# Patient Record
Sex: Female | Born: 1990 | Race: White | Hispanic: Yes
Health system: Southern US, Community
[De-identification: ages and names within clinical notes are randomized; demographics above are authoritative.]

---

## 2019-10-18 ENCOUNTER — Emergency Department (HOSPITAL_COMMUNITY)
Admission: EM | Admit: 2019-10-18 | Discharge: 2019-10-19 | Disposition: A | Discharge status: Home / Self Care | Payer: Self-pay | Attending: Emergency Medicine | Admitting: Emergency Medicine

## 2019-10-18 ENCOUNTER — Emergency Department (HOSPITAL_COMMUNITY): Payer: Self-pay

## 2019-10-18 ENCOUNTER — Encounter (HOSPITAL_COMMUNITY): Payer: Self-pay

## 2019-10-18 ENCOUNTER — Other Ambulatory Visit: Payer: Self-pay

## 2019-10-18 DIAGNOSIS — R0602 Shortness of breath: Secondary | ICD-10-CM | POA: Insufficient documentation

## 2019-10-18 DIAGNOSIS — R42 Dizziness and giddiness: Secondary | ICD-10-CM | POA: Insufficient documentation

## 2019-10-18 DIAGNOSIS — R0789 Other chest pain: Secondary | ICD-10-CM | POA: Insufficient documentation

## 2019-10-18 DIAGNOSIS — D649 Anemia, unspecified: Secondary | ICD-10-CM | POA: Insufficient documentation

## 2019-10-18 DIAGNOSIS — G43009 Migraine without aura, not intractable, without status migrainosus: Secondary | ICD-10-CM | POA: Insufficient documentation

## 2019-10-18 LAB — BASIC METABOLIC PANEL
Anion gap: 10 (ref 5–15)
BUN: 7 mg/dL (ref 6–20)
CO2: 20 mmol/L — ABNORMAL LOW (ref 22–32)
Calcium: 9.6 mg/dL (ref 8.9–10.3)
Chloride: 109 mmol/L (ref 98–111)
Creatinine, Ser: 0.54 mg/dL (ref 0.44–1.00)
GFR calc Af Amer: >60 mL/min (ref >60)
GFR calc non Af Amer: >60 mL/min (ref >60)
Glucose, Bld: 91 mg/dL (ref 70–99)
Potassium: 3.2 mmol/L — ABNORMAL LOW (ref 3.5–5.1)
Sodium: 139 mmol/L (ref 135–145)

## 2019-10-18 LAB — CBC
HCT: 28.2 % — ABNORMAL LOW (ref 36.0–46.0)
Hemoglobin: 8.3 g/dL — ABNORMAL LOW (ref 12.0–15.0)
MCH: 21.1 pg — ABNORMAL LOW (ref 26.0–34.0)
MCHC: 29.4 g/dL — ABNORMAL LOW (ref 30.0–36.0)
MCV: 71.6 fL — ABNORMAL LOW (ref 80.0–100.0)
Platelets: 441 10*3/uL — ABNORMAL HIGH (ref 150–400)
RBC: 3.94 MIL/uL (ref 3.87–5.11)
RDW: 18.9 % — ABNORMAL HIGH (ref 11.5–15.5)
WBC: 4.7 10*3/uL (ref 4.0–10.5)
nRBC: 0 % (ref 0.0–0.2)

## 2019-10-18 LAB — I-STAT BETA HCG BLOOD, ED (MC, WL, AP ONLY): I-stat hCG, quantitative: <5 m[IU]/mL (ref <5)

## 2019-10-18 LAB — TROPONIN I (HIGH SENSITIVITY): Troponin I (High Sensitivity): <2 ng/L (ref <18)

## 2019-10-18 NOTE — ED Triage Notes (Signed)
Pt from work with ems for chest pain, worse with deep inspiration VSS, pt a.o, resp e.u at this time.

## 2019-10-19 LAB — TROPONIN I (HIGH SENSITIVITY): Troponin I (High Sensitivity): <2 ng/L (ref <18)

## 2019-10-19 LAB — D-DIMER, QUANTITATIVE: D-Dimer, Quant: <0.27 ug/mL-FEU (ref 0.00–0.50)

## 2019-10-19 MED ORDER — MECLIZINE HCL 25 MG PO TABS
25.0000 mg | ORAL_TABLET | Freq: Once | ORAL | Status: AC
  Administered 2019-10-19: 01:00:00 25 mg via ORAL
  Filled 2019-10-19: qty 1

## 2019-10-19 MED ORDER — KETOROLAC TROMETHAMINE 30 MG/ML IJ SOLN
30.0000 mg | Freq: Once | INTRAMUSCULAR | Status: AC
  Administered 2019-10-19: 01:00:00 30 mg via INTRAVENOUS
  Filled 2019-10-19: qty 1

## 2019-10-19 MED ORDER — FERROUS SULFATE 325 (65 FE) MG PO TABS: 325.0000 mg | ORAL_TABLET | Freq: Every day | ORAL | 0 refills | Status: AC

## 2019-10-19 MED ORDER — SODIUM CHLORIDE 0.9 % IV BOLUS (SEPSIS)
1000.0000 mL | Freq: Once | INTRAVENOUS | Status: AC
  Administered 2019-10-19: 1000 mL via INTRAVENOUS

## 2019-10-19 MED ORDER — MECLIZINE HCL 25 MG PO TABS: 25.0000 mg | ORAL_TABLET | Freq: Three times a day (TID) | ORAL | 0 refills | Status: AC | PRN

## 2019-10-19 MED ORDER — IBUPROFEN 800 MG PO TABS: 800.0000 mg | ORAL_TABLET | Freq: Three times a day (TID) | ORAL | 0 refills | Status: AC | PRN

## 2019-10-19 NOTE — ED Provider Notes (Addendum)
TIME SEEN: 12:39 AM  CHIEF COMPLAINT: chest pain  HPI: Patient is a 29 y.o. F with no PMH who presents to ED with stabbing chest pain and SOB and vertigo.  Also c/o throbbing HA.  Has nausea and no vomiting.  No fever or cough.  Working at [employer] when started.  Feels like her legs are numb.  Patient is able to walk.  No back pain, urinary retention, incontinence.  ROS: See HPI Constitutional: no fever  Eyes: no drainage  ENT: no runny nose   Cardiovascular:   chest pain  Resp:  SOB  GI: no vomiting GU: no dysuria Integumentary: no rash  Allergy: no hives  Musculoskeletal: no leg swelling  Neurological: no slurred speech ROS otherwise negative  PAST MEDICAL HISTORY/PAST SURGICAL HISTORY:  History reviewed. No pertinent past medical history.  MEDICATIONS:  Prior to Admission medications   Not on File    ALLERGIES:  Not on File  SOCIAL HISTORY:  Social History   Tobacco Use  . Smoking status: Not on file  Substance Use Topics  . Alcohol use: Not on file    FAMILY HISTORY: No family history on file.  EXAM: BP 104/65 (BP Location: Left Arm)   Pulse 76   Temp 97.9 F (36.6 C) (Oral)   Resp 20   SpO2 100%  CONSTITUTIONAL: Alert and oriented and responds appropriately to questions. Well-appearing; well-nourished HEAD: Normocephalic EYES: Conjunctivae clear, pupils appear equal, EOM appear intact ENT: normal nose; moist mucous membranes NECK: Supple, normal ROM CARD: RRR; S1 and S2 appreciated; no murmurs, no clicks, no rubs, no gallops RESP: Normal chest excursion without splinting or tachypnea; breath sounds clear and equal bilaterally; no wheezes, no rhonchi, no rales, no hypoxia or respiratory distress, speaking full sentences ABD/GI: Normal bowel sounds; non-distended; soft, non-tender, no rebound, no guarding, no peritoneal signs, no hepatosplenomegaly BACK:  The back appears normal EXT: Normal ROM in all joints; no deformity noted, no edema; no  cyanosis SKIN: Normal color for age and race; warm; no rash on exposed skin NEURO: Moves all extremities equally, poor effort with lifting legs off the bed, reports decreased sensation in bilateral lower extremities but normal sensation diffusely otherwise, normal speech, normal movement of her upper extremities PSYCH: The patient's mood and manner are appropriate.   MEDICAL DECISION MAKING: Patient here with pleuritic chest pain.  Troponin negative.  EKG shows new ischemic change.  She is anemic with no old for comparison.  No bloody stools or melena.  She has regular menstrual cycles.  Recommended close PCP follow-up and will start iron supplements.  Will obtain D-dimer to rule out PE.  Chest x-ray clear.  Low suspicion for ACS, dissection.  No infectious symptoms.  Patient also complaining of headache and feeling like her legs are numb and weak.  No back pain, bowel or bladder incontinence.  Neurologic exam seems to be very effort dependent.  Doubt stroke given symptoms are bilateral.  She has no complaints of back pain, bowel or bladder incontinence, urinary retention.  Doubt cauda equina, spinal stenosis, epidural abscess or hematoma, discitis or osteomyelitis.  Patient also complaining of vertigo.  We will treat symptoms with IV fluids, meclizine, Toradol and reassess.  ED PROGRESS: Patient reports headache has improved.  She now tells me that she has a history of migraine headaches and this feels similar.  Will discharge with prescription of ibuprofen.  She has been able to ambulate on her own with steady gait not requiring assistance.  Will  discharge with meclizine as needed for vertigo.  Suspect peripheral in nature.  Doubt stroke.  Symptoms extremely atypical.  Able to eat and drink here without difficulty.  No vomiting.  Suspect anxiety contributing to her symptoms.  She has had 2 troponins that have been negative and a negative D-dimer.  I feel she is safe for discharge home.   At  this time, I do not feel there is any life-threatening condition present. I have reviewed, interpreted and discussed all results (EKG, imaging, lab, urine as appropriate) and exam findings with patient/family. I have reviewed nursing notes and appropriate previous records.  I feel the patient is safe to be discharged home without further emergent workup and can continue workup as an outpatient as needed. Discussed usual and customary return precautions. Patient/family verbalize understanding and are comfortable with this plan.  Outpatient follow-up has been provided as needed. All questions have been answered.    EKG Interpretation  Date/Time:  Monday October 18 2019 16:40:41 EST Ventricular Rate:  91 PR Interval:  138 QRS Duration: 72 QT Interval:  348 QTC Calculation: 428 R Axis:   78 Text Interpretation: Normal sinus rhythm Normal ECG No significant change since last tracing Confirmed by Rochele Raring [phone]) on 10/19/2019 12:39:28 AM          Laban Emperor was evaluated in Emergency Department on 10/19/2019 for the symptoms described in the history of present illness. She was evaluated in the context of the global COVID-19 pandemic, which necessitated consideration that the patient might be at risk for infection with the SARS-CoV-2 virus that causes COVID-19. Institutional protocols and algorithms that pertain to the evaluation of patients at risk for COVID-19 are in a state of rapid change based on information released by regulatory bodies including the CDC and federal and state organizations. These policies and algorithms were followed during the patient's care in the ED.  Patient was seen wearing N95, face shield, gloves.      Ward, Layla Maw, DO 10/19/19 [phone]

## 2019-10-19 NOTE — ED Notes (Signed)
Pt came to the ED per triage complaint. Pt conscious, breathing, and A&Ox4. Pt brought back to [address] via wheelchair. Pt endorses "I started to have chest pain at 11am yesterday while at work and I began to feel faint and passed out". Pt endorses mid-sternal chest pain that's stabbing like. Chest rise and fall equally with non-labored breathing. Lungs clear apex to base. Abd soft and non-tender. Pt denies chest pain, n/v/d, shortness of breath, and f/c. Pt endorses blurred vision and left sided paresthesia. Pt endorses pain 8 out of 10 pain that's vice-like. PIVC placed on the RAC with a 20G which had positive blood return and flushed without pain or infiltration. Blood collected, labeled, and sent to lab. Bed in lowest position with call light within reach. Pt on continuous blood pressure, pulse ox, and cardiac monitor. Will continue to monitor. Awaiting MD eval. No distress noted.

## 2019-10-19 NOTE — Discharge Instructions (Addendum)
You may alternate Tylenol 1000 mg every 6 hours as needed for pain, fever and Ibuprofen 800 mg every 8 hours as needed for pain, fever.  Please take Ibuprofen with food.  Do not take more than 4000 mg of Tylenol (acetaminophen) in a 24 hour period.  Your hemoglobin was 8.3 today which I suspect is chronic for you.  I recommend that you begin taking iron tablets and follow-up with a primary care physician to have this rechecked.    Steps to find a Primary Care Provider (PCP):  Call [phone] or [phone] to access "[place] Find a Doctor Service."  2.  You may also go on the [facility] website at [email or web address]  3.  [facility] also frequently accepts new patients.  [facility]  [address] [place] [address] [phone]  4.  There are also multiple [facility], [facility] and [facility] practices throughout the [place] that are frequently accepting new patients. You may find a clinic that is close to your home and contact them.  [email or web address] [phone]  [facility] [email or web address]  [email or web address] [phone]  [facility] [email or web address] [phone]  5.  Local Health Departments also can provide primary care services.  [facility]  [address] [place] [address] [phone]  [facility] Department [address] [place] [address] [phone]  [facility] Department [address] [place] [address]  [place] [address] [phone]

## 2019-10-19 NOTE — ED Notes (Signed)
PIVC removed with intact catheter tip.  

## 2019-10-19 NOTE — ED Notes (Signed)
Pt able to eat and drink with no problems 

## 2020-08-24 IMAGING — DX DG CHEST 2V
2 series · 2 of 2 positions shown · non-contrast
Comparison: None.

CLINICAL DATA: 28-year-old female with chest pain.

EXAM:
CHEST - 2 VIEW

[chest pa]
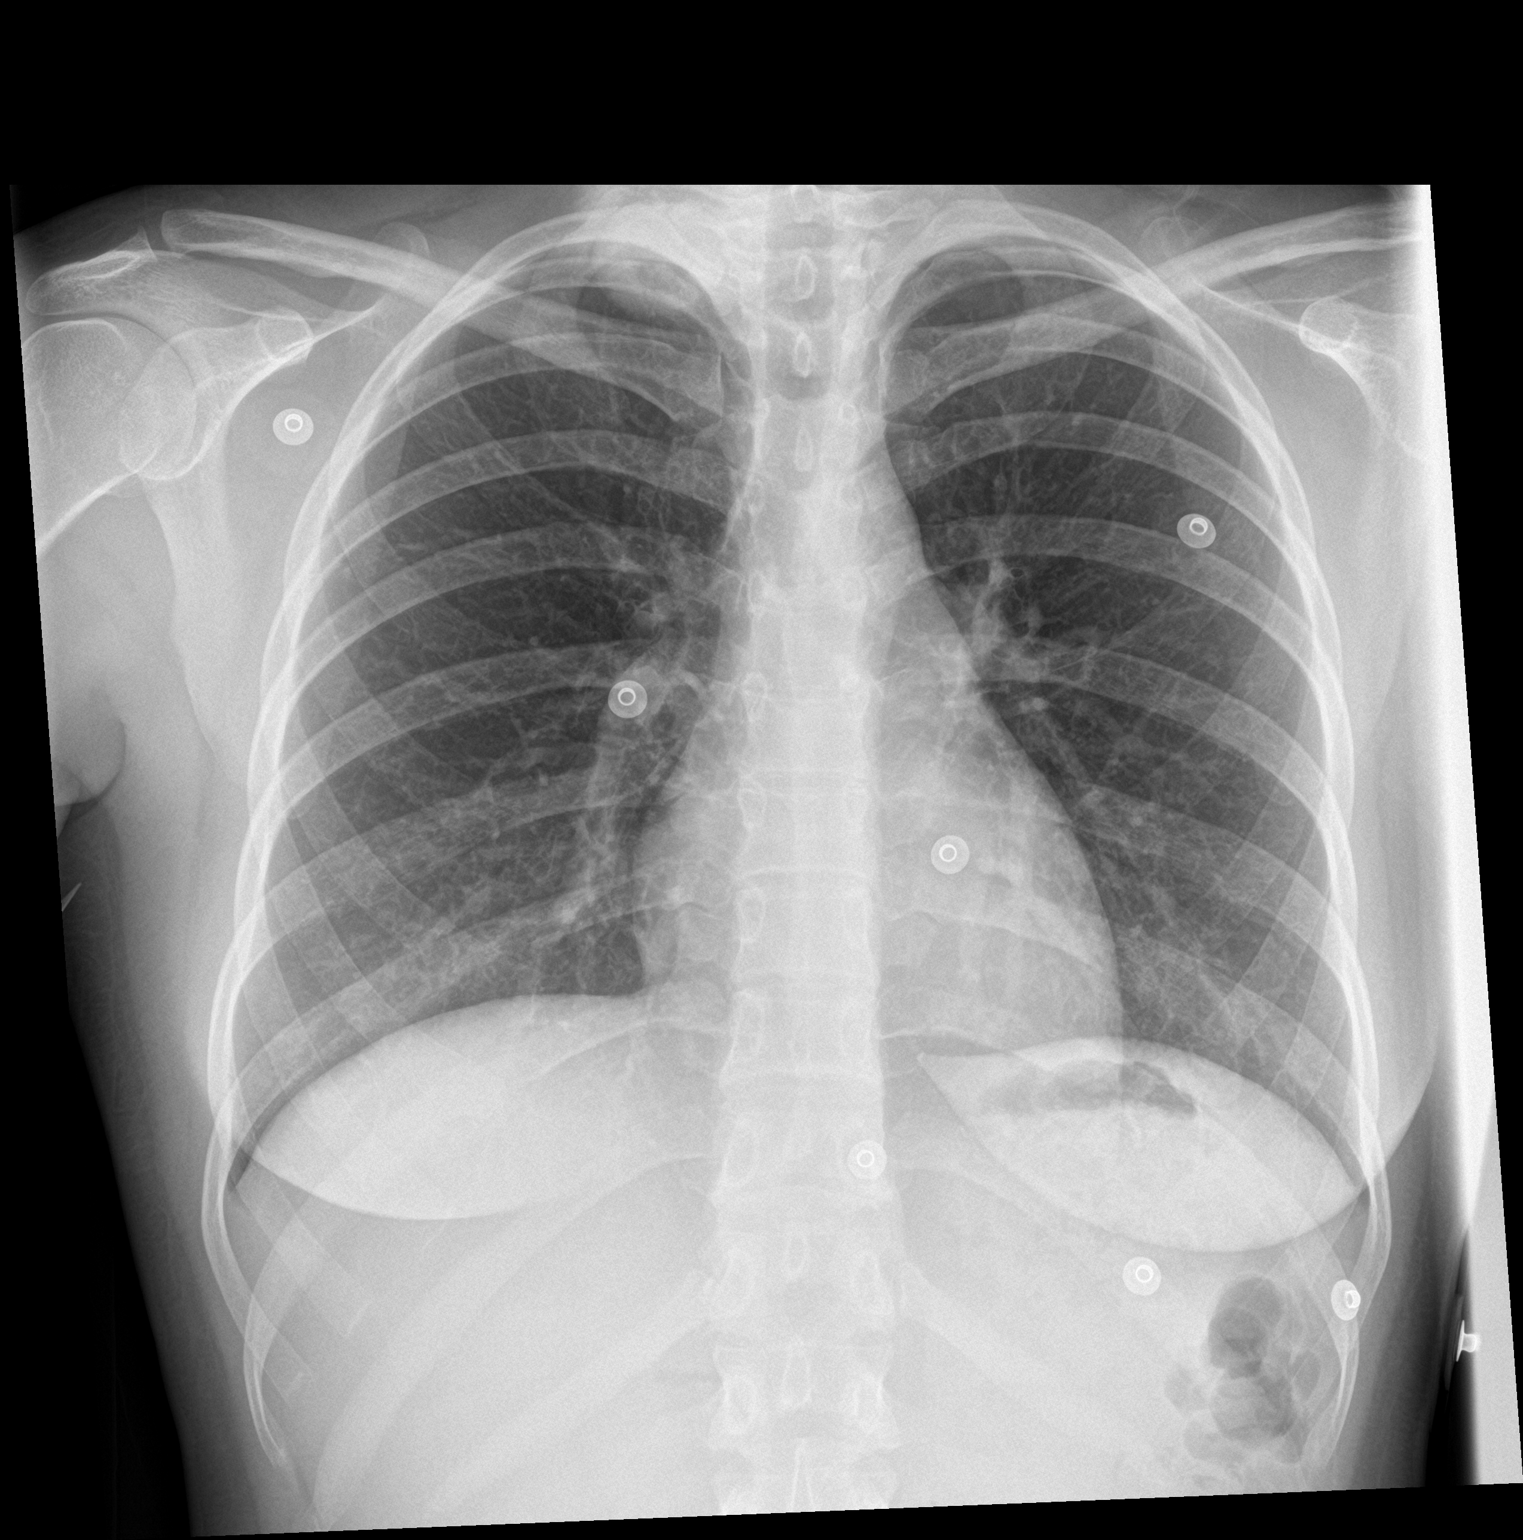

[chest lat]
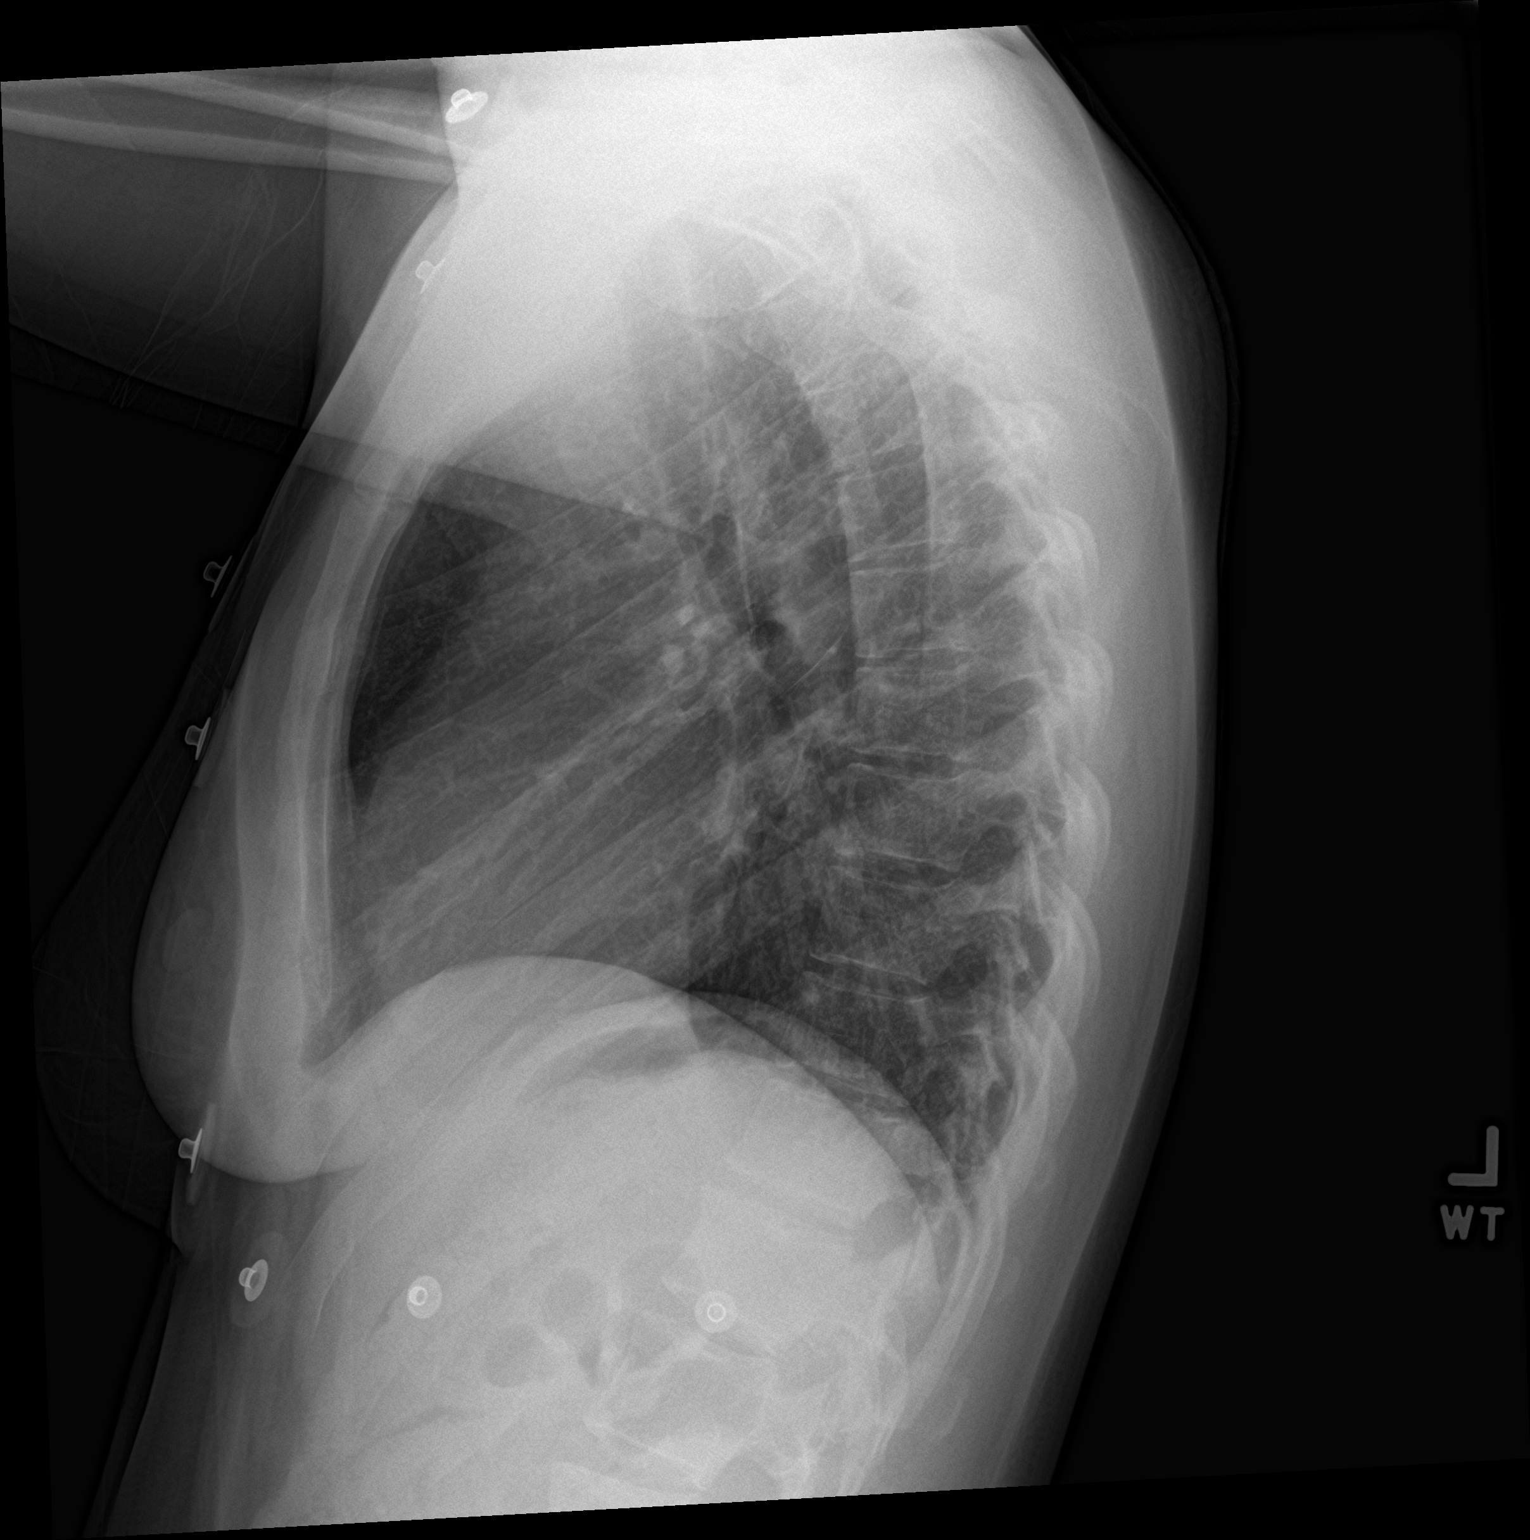

[2 of 2 positions shown; findings below may reference images not displayed]

FINDINGS: The heart size and mediastinal contours are within normal limits.
Both lungs are clear. The visualized skeletal structures are
unremarkable.
IMPRESSION: No active cardiopulmonary disease.
